# Patient Record
Sex: Female | Born: 1985 | Race: Black or African American | Hispanic: No | Marital: Single | Smoking: Never smoker
Health system: Southern US, Community
[De-identification: ages and names within clinical notes are randomized; demographics above are authoritative.]

---
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
## 2004-01-29 ENCOUNTER — Emergency Department (HOSPITAL_COMMUNITY): Admission: EM | Admit: 2004-01-29 | Discharge: 2004-01-29 | Payer: Self-pay

## 2004-01-31 ENCOUNTER — Other Ambulatory Visit: Admission: RE | Admit: 2004-01-31 | Discharge: 2004-01-31 | Payer: Self-pay | Admitting: Obstetrics and Gynecology

## 2004-05-09 ENCOUNTER — Emergency Department (HOSPITAL_COMMUNITY): Admission: EM | Admit: 2004-05-09 | Discharge: 2004-05-09 | Payer: Self-pay | Admitting: Emergency Medicine

## 2004-10-06 ENCOUNTER — Emergency Department (HOSPITAL_COMMUNITY): Admission: EM | Admit: 2004-10-06 | Discharge: 2004-10-07 | Payer: Self-pay | Admitting: Emergency Medicine

## 2005-03-05 ENCOUNTER — Other Ambulatory Visit: Admission: RE | Admit: 2005-03-05 | Discharge: 2005-03-05 | Payer: Self-pay | Admitting: Obstetrics and Gynecology

## 2005-04-15 ENCOUNTER — Emergency Department (HOSPITAL_COMMUNITY): Admission: EM | Admit: 2005-04-15 | Discharge: 2005-04-15 | Payer: Self-pay | Admitting: Emergency Medicine

## 2005-08-26 ENCOUNTER — Emergency Department (HOSPITAL_COMMUNITY): Admission: EM | Admit: 2005-08-26 | Discharge: 2005-08-26 | Payer: Self-pay | Admitting: Family Medicine

## 2005-09-27 ENCOUNTER — Inpatient Hospital Stay (HOSPITAL_COMMUNITY): Admission: AD | Admit: 2005-09-27 | Discharge: 2005-10-03 | Payer: Self-pay | Admitting: Obstetrics and Gynecology

## 2005-10-07 ENCOUNTER — Inpatient Hospital Stay (HOSPITAL_COMMUNITY): Admission: AD | Admit: 2005-10-07 | Discharge: 2005-10-07 | Payer: Self-pay | Admitting: Obstetrics and Gynecology

## 2005-10-16 ENCOUNTER — Ambulatory Visit: Admission: RE | Admit: 2005-10-16 | Discharge: 2005-10-16 | Payer: Self-pay | Admitting: Obstetrics and Gynecology

## 2006-04-16 ENCOUNTER — Emergency Department (HOSPITAL_COMMUNITY): Admission: EM | Admit: 2006-04-16 | Discharge: 2006-04-16 | Payer: Self-pay | Admitting: Family Medicine

## 2007-12-07 IMAGING — US US OB TRANSVAGINAL MODIFY
1 series · 13 of 28 positions shown · non-contrast
Comparison: none

CLINICAL DATA: Headache.  Question pregnancy induced hypertension, contractions.

[Series 1: us ob transvaginal modify · 0.29mm/px · 13 of 29 slices shown]
[im 2/29]
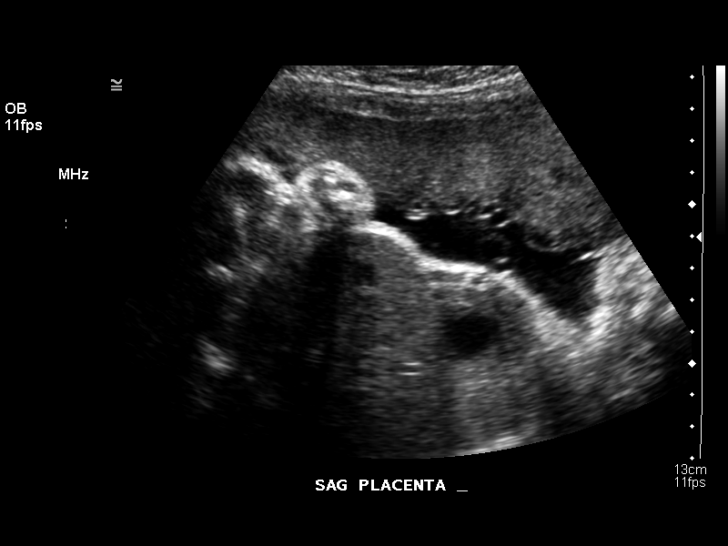
[im 4/29]
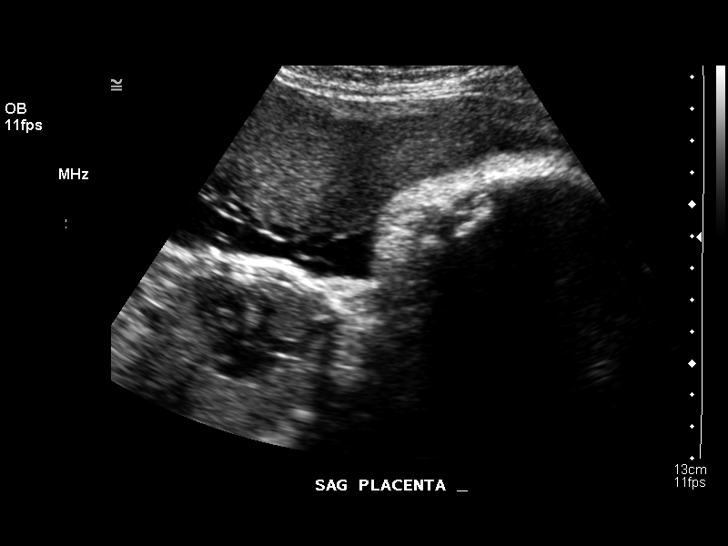
[im 6/29]
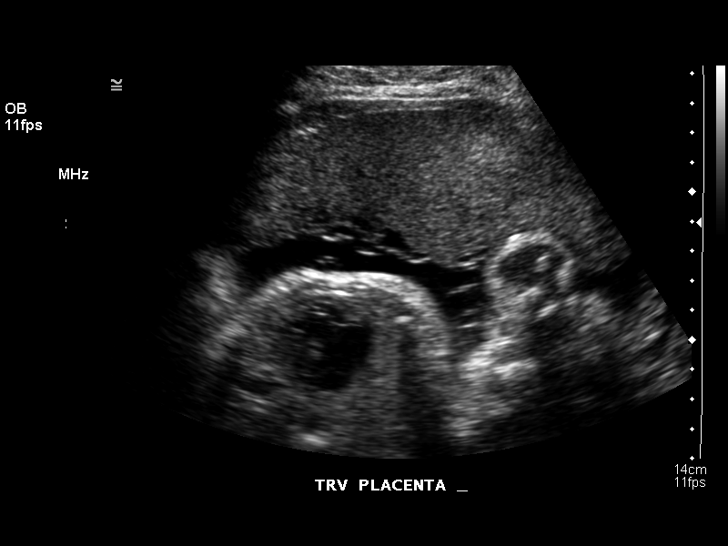
[im 8/29]
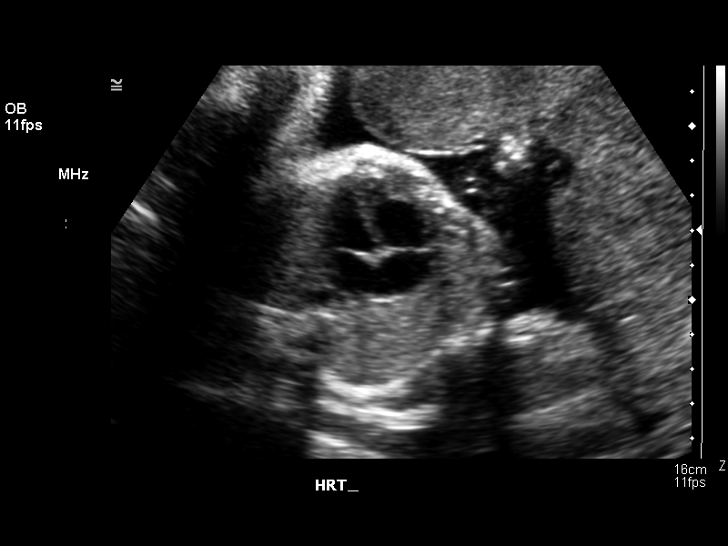
[im 10/29]
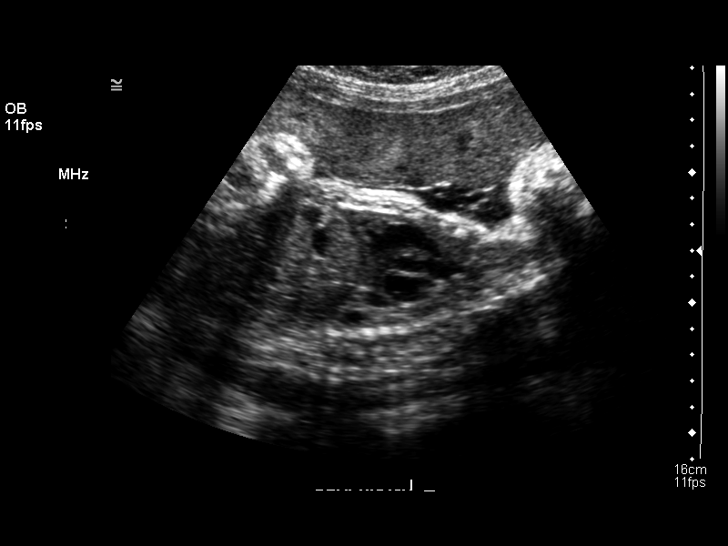
[im 12/29]
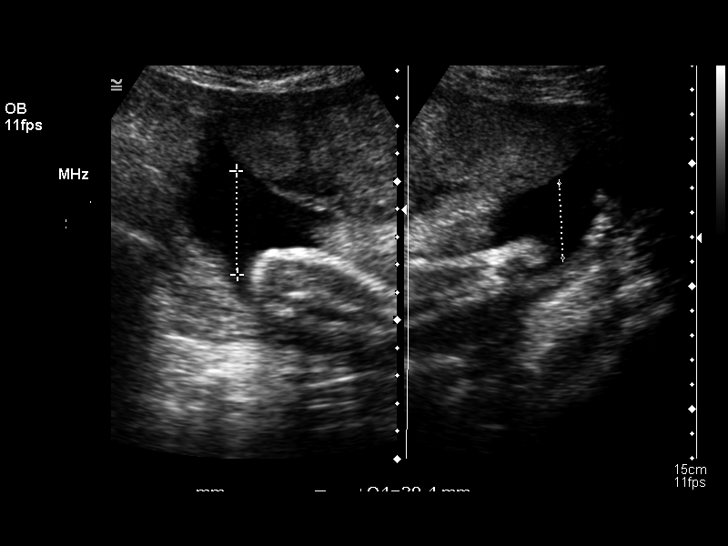
[im 15/29]
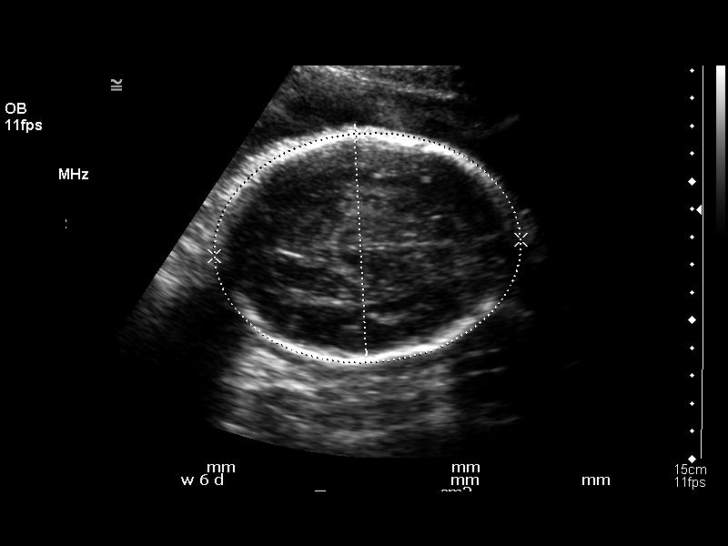
[im 17/29]
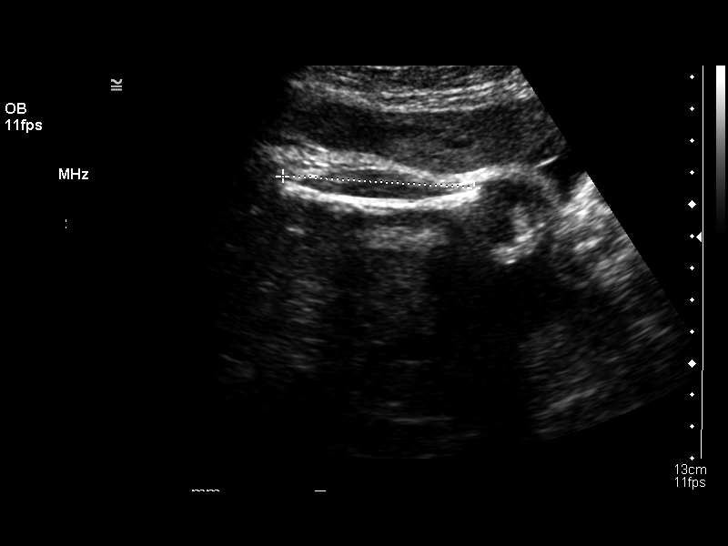
[im 19/29]
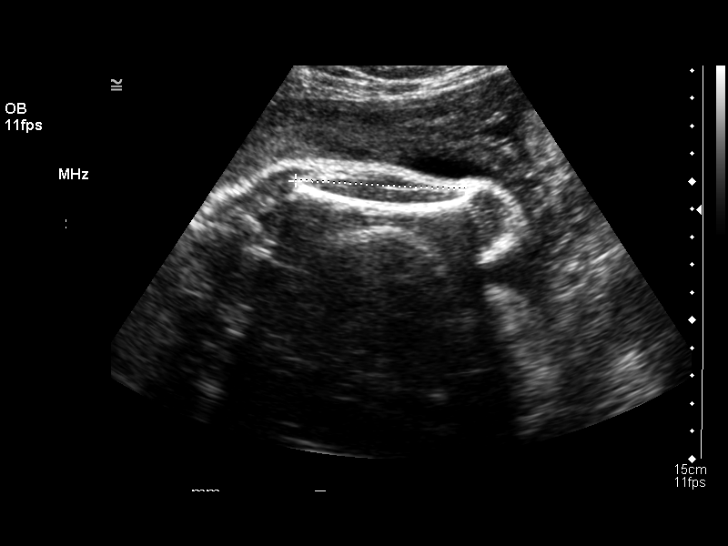
[im 21/29]
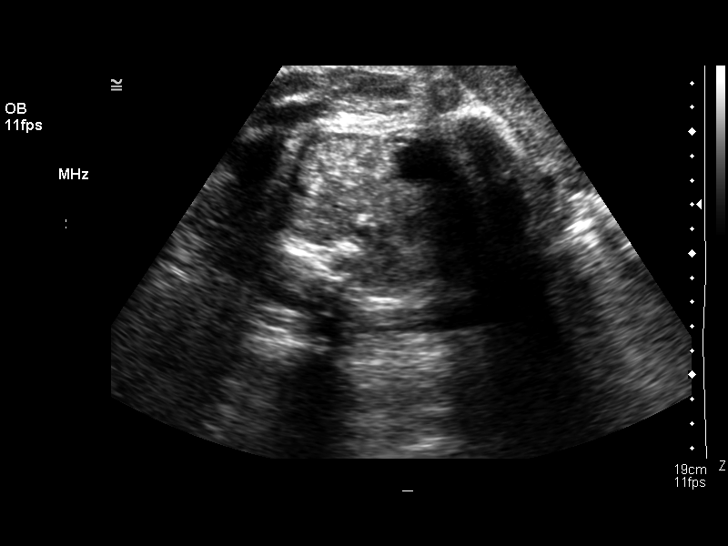
[im 23/29]
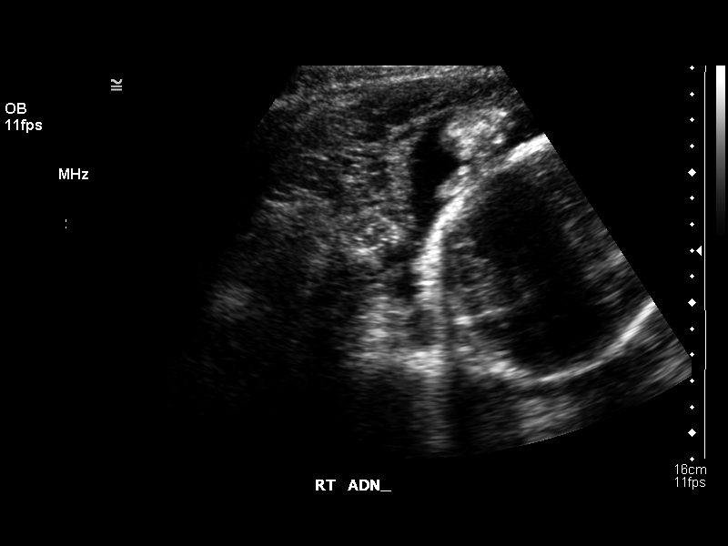
[im 25/29]
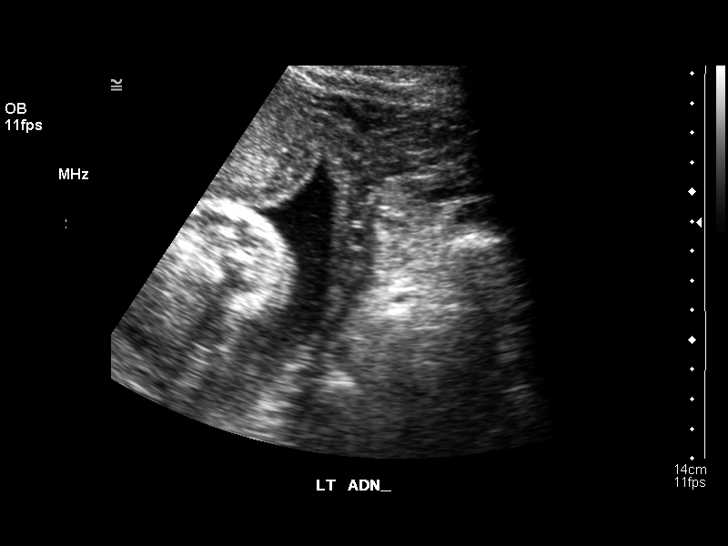
[im 27/29]
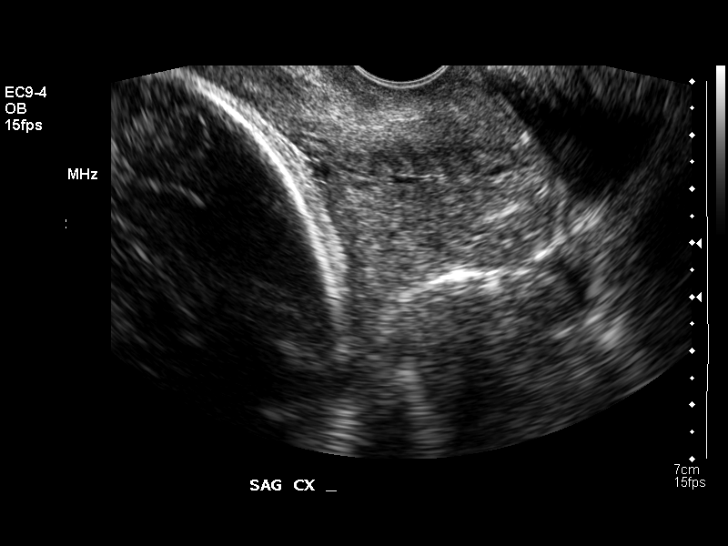

[13 of 28 positions shown; findings below may reference images not displayed]

OBSTETRICAL ULTRASOUND AND TRANSVAGINAL OB US:
 Transvaginal exam was performed to optimally assess cervical length.
 Number of Fetuses: 1
 Heart Rate:  147
 Movement:  Yes
 Breathing:  No  
 Presentation:  Transverse with head on the maternal left
 Placental Location:  Anterior
 Grade:  I
 Previa:  No
 Amniotic Fluid (Subjective):  Normal
 Amniotic Fluid (Objective):   12.5 cm AFI (5th ? 95th%ile = 8.3 ? 24.5 cm)

 FETAL BIOMETRY
 BPD:   8.4 cm   33 w 4 d
 HC:   30.4 cm   33 w 5 d
 AC:   28.8 cm  32 w 6 d
 FL:    6.2 cm   32 w 0 d

 MEAN GA:  33 w 0 d  US EDC:  11/15/05

 EFW: 2744 + / - 302 g (H) 50th ? 75th%ile (8089 ? 2811 g) For 33 wks

 FETAL ANATOMY
 Lateral Ventricles:    Not visualized
 Thalami/CSP:      Not visualized
 Posterior Fossa:  Not visualized
 Nuchal Region:    Not visualized
 Spine:      Not visualized
 4 Chamber Heart on Left:      Visualized
 Stomach on Left:      Visualized
 3 Vessel Cord:    Not visualized
 Cord Insertion site:    Not visualized
 Kidneys:  Visualized
 Bladder:  Visualized
 Extremities:      Not visualized

 ADDITIONAL ANATOMY VISUALIZED:  Diaphragm.

 MATERNAL UTERINE AND ADNEXAL FINDINGS
 Cervix:   3.4 cm transvaginally
IMPRESSION: 1.  Single live intrauterine gestation in transverse presentation, head maternal left, with average ultrasound 33 weeks 0 days (EDC 11/15/05), which is concordant with the gestational age by last menstrual period of 32 weeks 5 days (EDC by LMP 11/17/05).
 [DATE].  The cervix is long and closed.

## 2007-12-09 IMAGING — US US FETAL BPP W/O NONSTRESS
1 series · 14 of 28 positions shown · non-contrast
Comparison: 09/27/05.

CLINICAL DATA: 20-year-old with severe headache.  Pregnancy induced hypertension.  

 BIOPHYSICAL PROFILE

[Series 1: us fetal bpp w/o nonstress · 0.22mm/px · 14 of 28 slices shown]
[im 2/28]
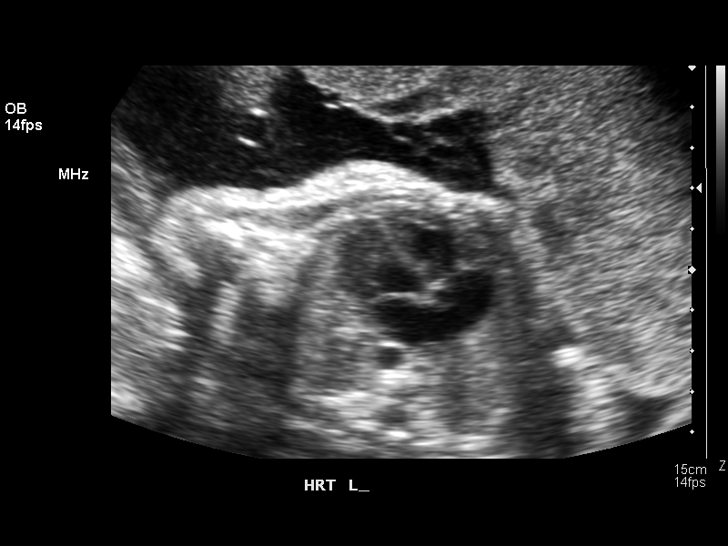
[im 4/28]
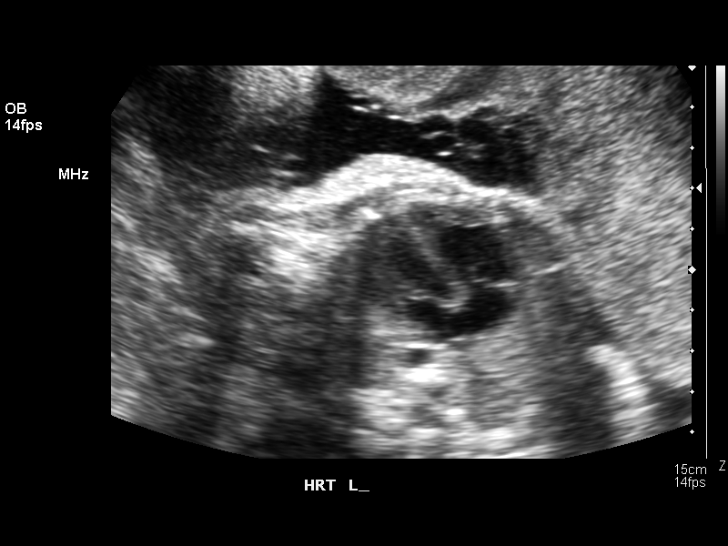
[im 6/28]
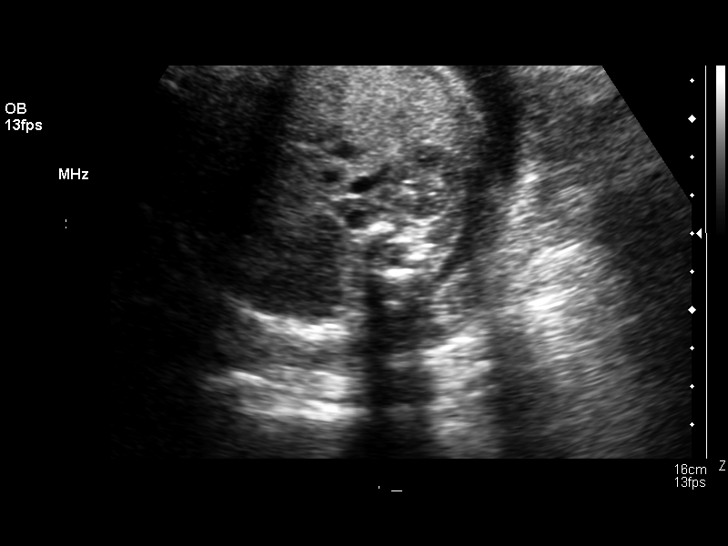
[im 8/28]
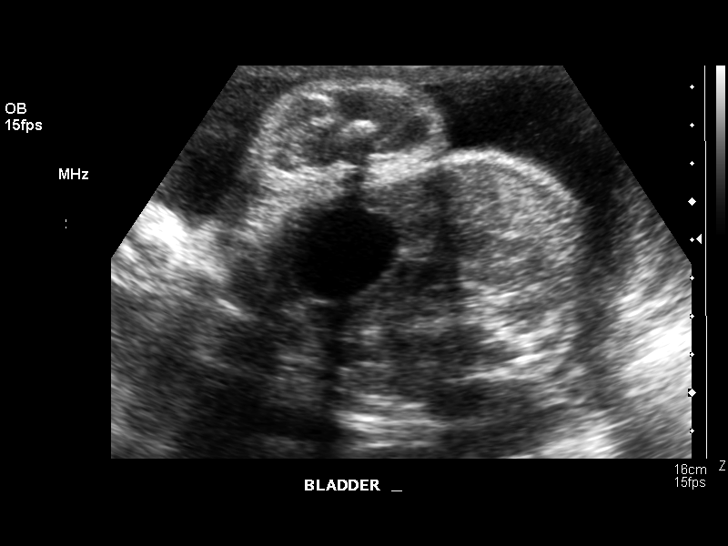
[im 10/28]
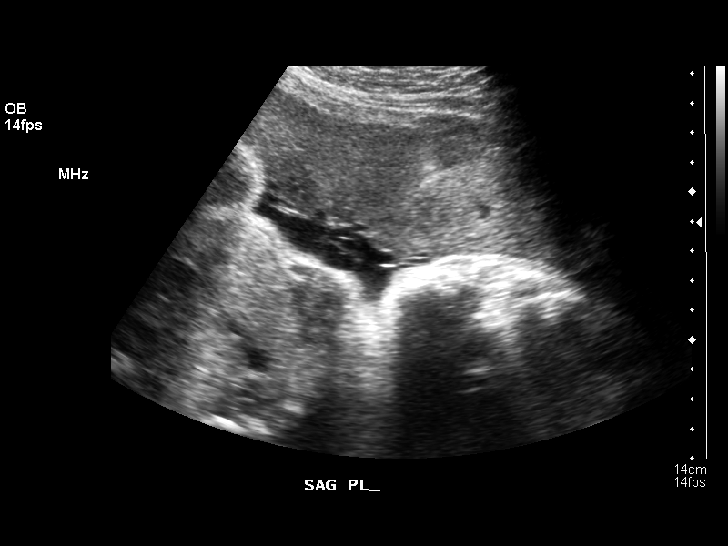
[im 12/28]
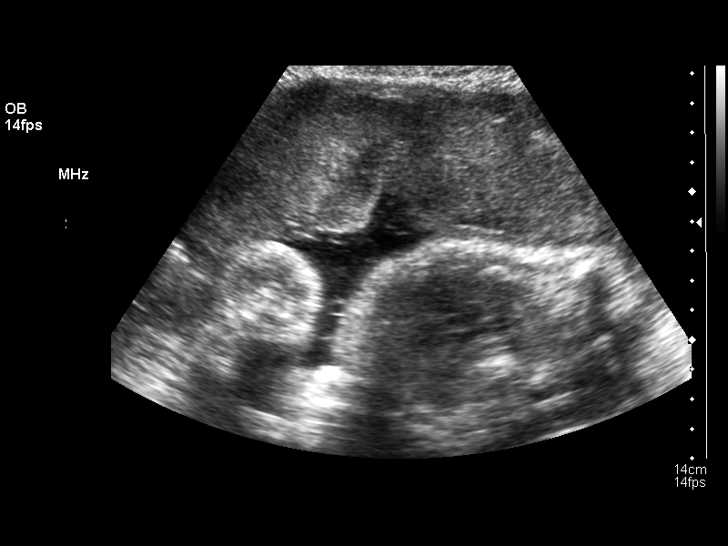
[im 14/28]
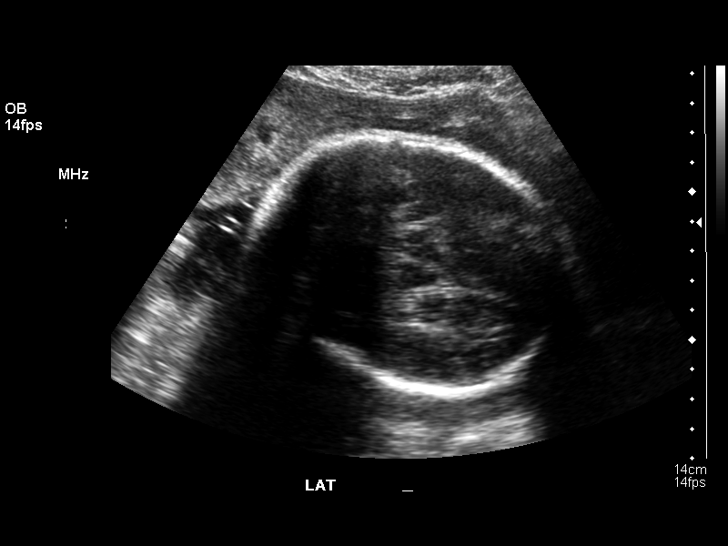
[im 16/28]
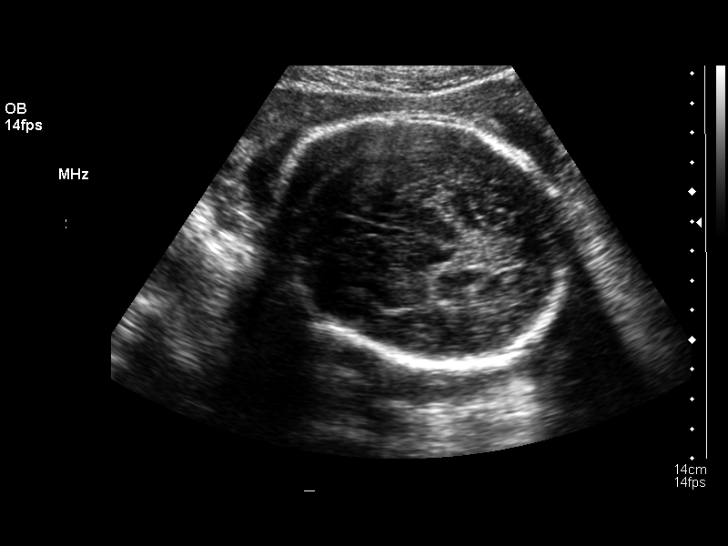
[im 18/28]
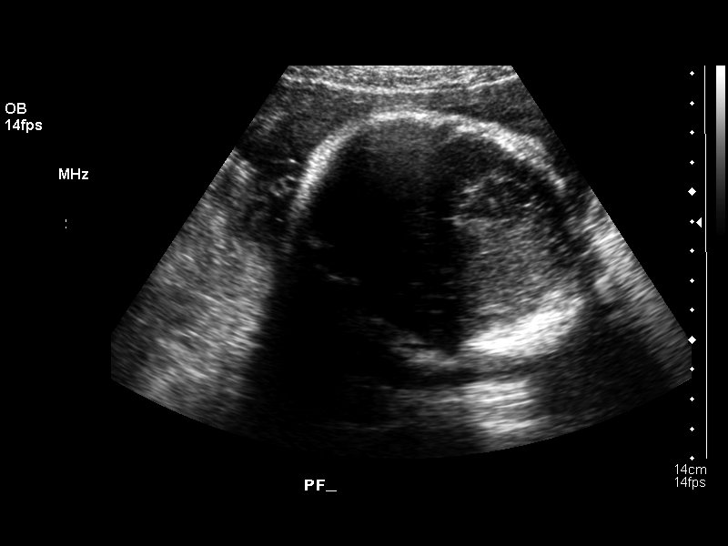
[im 20/28]
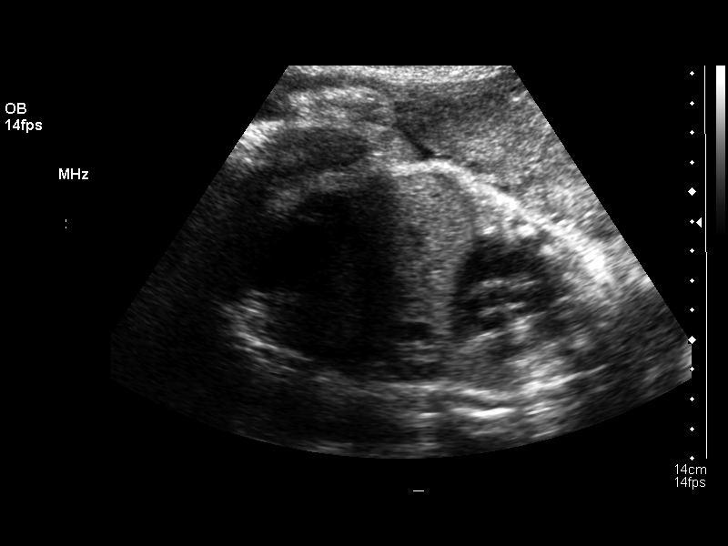
[im 22/28]
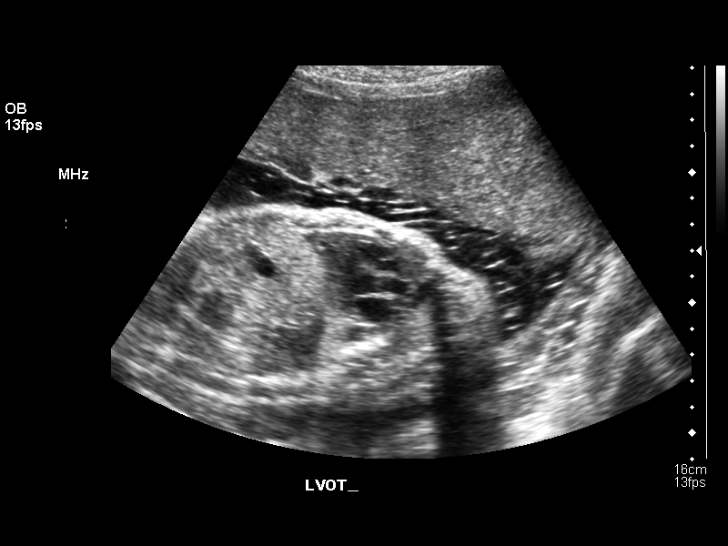
[im 24/28]
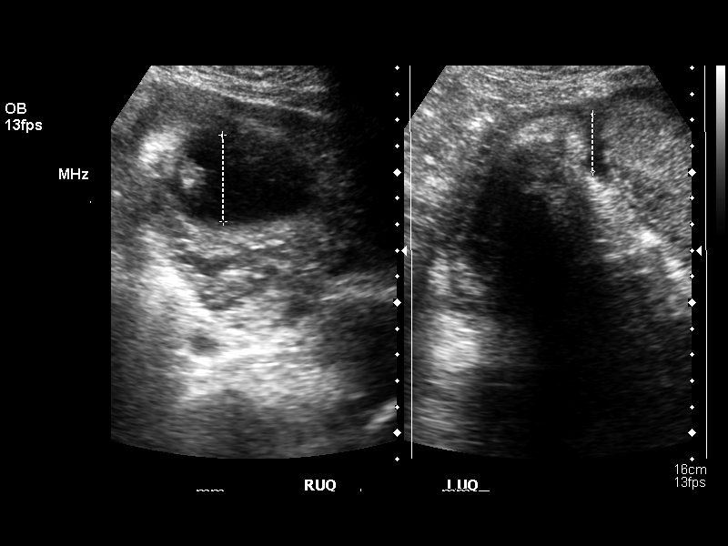
[im 26/28]
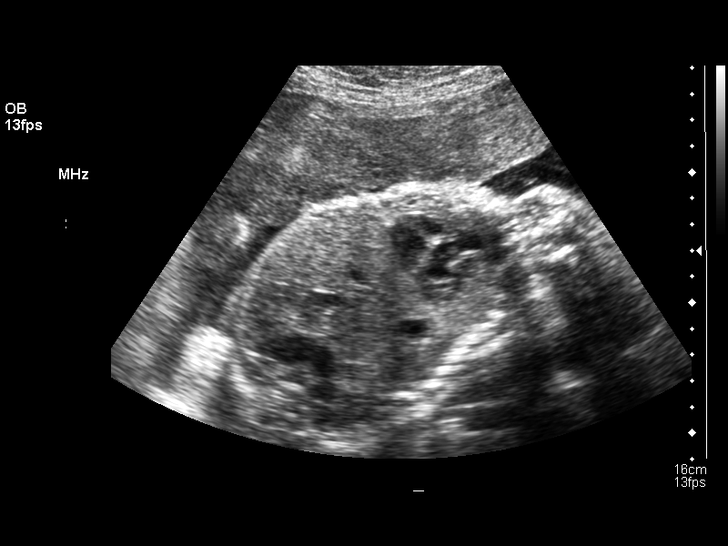
[im 28/28]
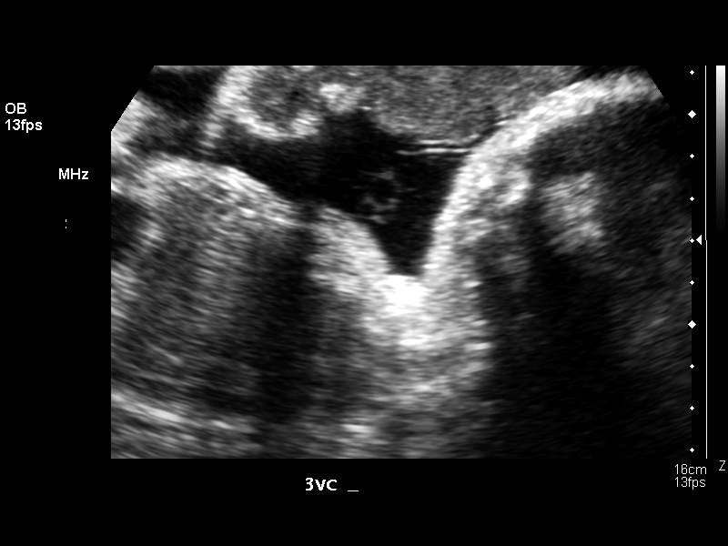

[14 of 28 positions shown; findings below may reference images not displayed]

Number of Fetuses: 1
 Heart rate:  150
 Movement:  Yes
 Breathing:  No
 Presentation:  Cephalic
 Placental Location:  Anterior
 Grade:  II
 Previa:  No
 Amniotic Fluid (Subjective): Normal
 Amniotic Fluid (Objective): 12.0 cm AFI (5th - 95th%ile = 8.3 ? 24.5 cm for 33 wks)

 Fetal measurements and complete anatomic evaluation were not requested.  The following fetal anatomy was visualized on this exam:  Lateral ventricles, thalami, posterior fossa, four chamber heart, stomach, 3-vessel cord, kidneys, bladder, LVOT, RVOT, orbits, and diaphragm.  

 BPP SCORING
 Movements:  2  Time:  30 minutes
 Breathing:  0
 Tone:  2
 Amniotic Fluid:  2
 Total Score:  6

 MATERNAL UTERINE AND ADNEXAL FINDINGS
 Cervix:   Not evaluated.
IMPRESSION: 1.  Single living intrauterine fetus in cephalic presentation with anterior, grade II placenta and normal amniotic fluid volume.  
 2.  Biophysical profile score [DATE] at 30 minutes.  Zero points for breathing.

## 2010-12-19 ENCOUNTER — Inpatient Hospital Stay (INDEPENDENT_AMBULATORY_CARE_PROVIDER_SITE_OTHER)
Admission: RE | Admit: 2010-12-19 | Discharge: 2010-12-19 | Disposition: A | Discharge status: Home / Self Care | Payer: Medicaid Other | Source: Ambulatory Visit | Attending: Family Medicine | Admitting: Family Medicine

## 2010-12-19 DIAGNOSIS — R51 Headache: Secondary | ICD-10-CM

## 2012-05-06 ENCOUNTER — Encounter (HOSPITAL_COMMUNITY): Payer: Self-pay | Admitting: *Deleted

## 2012-05-06 ENCOUNTER — Emergency Department (HOSPITAL_COMMUNITY)
Admission: EM | Admit: 2012-05-06 | Discharge: 2012-05-07 | Disposition: A | Discharge status: Home / Self Care | Payer: Medicaid Other | Attending: Emergency Medicine | Admitting: Emergency Medicine

## 2012-05-06 DIAGNOSIS — Z3202 Encounter for pregnancy test, result negative: Secondary | ICD-10-CM | POA: Insufficient documentation

## 2012-05-06 DIAGNOSIS — N390 Urinary tract infection, site not specified: Secondary | ICD-10-CM | POA: Insufficient documentation

## 2012-05-06 LAB — URINALYSIS, ROUTINE W REFLEX MICROSCOPIC
Nitrite: NEGATIVE
Specific Gravity, Urine: 1.031 — ABNORMAL HIGH (ref 1.005–1.030)
Urobilinogen, UA: 1 mg/dL (ref 0.0–1.0)

## 2012-05-06 LAB — POCT PREGNANCY, URINE: Preg Test, Ur: NEGATIVE

## 2012-05-06 LAB — URINE MICROSCOPIC-ADD ON

## 2012-05-06 NOTE — ED Notes (Signed)
Pt c/o severe burning; pain with urination; frequency x 1 wk; seen by pcp on Monday diagnosed with yeast infection and given diflucan; states at that time was tested for STDs and was neg; states also had neg urine at that time; c/o strong odor and cloudy urine

## 2012-05-07 MED ORDER — SULFAMETHOXAZOLE-TMP DS 800-160 MG PO TABS
1.0000 | ORAL_TABLET | Freq: Once | ORAL | Status: AC
  Administered 2012-05-07: 1 via ORAL
  Filled 2012-05-07: qty 1

## 2012-05-07 MED ORDER — PHENAZOPYRIDINE HCL 200 MG PO TABS
200.0000 mg | ORAL_TABLET | Freq: Three times a day (TID) | ORAL | Status: DC
  Filled 2012-05-07: qty 1

## 2012-05-07 MED ORDER — SULFAMETHOXAZOLE-TMP DS 800-160 MG PO TABS: 1.0000 | ORAL_TABLET | Freq: Two times a day (BID) | ORAL | Status: DC

## 2012-05-07 MED ORDER — PHENAZOPYRIDINE HCL 200 MG PO TABS
200.0000 mg | ORAL_TABLET | Freq: Three times a day (TID) | ORAL | Status: DC
  Administered 2012-05-07: 200 mg via ORAL

## 2012-05-07 MED ORDER — PHENAZOPYRIDINE HCL 200 MG PO TABS: 200.0000 mg | ORAL_TABLET | Freq: Three times a day (TID) | ORAL | Status: DC

## 2012-05-07 NOTE — ED Provider Notes (Signed)
History     CSN: [id number]  Arrival date & time 05/06/12  1955   First MD Initiated Contact with Patient 05/06/12 2317      Chief Complaint  Patient presents with  . Dysuria    (Consider location/radiation/quality/duration/timing/severity/associated sxs/prior treatment) HPI 27 yo female presents to the ER with complaint of dysuria.  She was seen at the health dept earlier in the week, had reported negative urine, neg STD check, and thought to have yeast infection.  Pt has taken medications, but symptoms have worsened.  No fever, no n/v,no back pain.  History reviewed. No pertinent past medical history.  History reviewed. No pertinent past surgical history.  No family history on file.  History  Substance Use Topics  . Smoking status: Never Smoker   . Smokeless tobacco: Not on file  . Alcohol Use: No    OB History   Grav Para Term Preterm Abortions TAB SAB Ect Mult Living                  Review of Systems  All other systems reviewed and are negative.    Allergies  Review of patient's allergies indicates no known allergies.  Home Medications   Current Outpatient Rx  Name  Route  Sig  Dispense  Refill  . fluconazole (DIFLUCAN) 150 MG tablet   Oral   Take 150 mg by mouth once.         . miconazole (MICOTIN) 2 % cream   Topical   Apply 1 application topically 2 (two) times daily.         . phenazopyridine (PYRIDIUM) 200 MG tablet   Oral   Take 1 tablet (200 mg total) by mouth 3 (three) times daily.   6 tablet   0   . sulfamethoxazole-trimethoprim (BACTRIM DS) 800-160 MG per tablet   Oral   Take 1 tablet by mouth 2 (two) times daily.   10 tablet   0     BP 114/74  Pulse 64  Temp(Src) 99 F (37.2 C) (Oral)  Resp 18  SpO2 100%  LMP 03/27/2012  Physical Exam  Nursing note and vitals reviewed. Constitutional: She is oriented to person, place, and time. She appears well-developed and well-nourished.  HENT:  Head: Normocephalic and  atraumatic.  Nose: Nose normal.  Mouth/Throat: Oropharynx is clear and moist.  Eyes: Conjunctivae and EOM are normal. Pupils are equal, round, and reactive to light.  Neck: Normal range of motion. Neck supple. No JVD present. No tracheal deviation present. No thyromegaly present.  Cardiovascular: Normal rate, regular rhythm, normal heart sounds and intact distal pulses.  Exam reveals no gallop and no friction rub.   No murmur heard. Pulmonary/Chest: Effort normal and breath sounds normal. No stridor. No respiratory distress. She has no wheezes. She has no rales. She exhibits no tenderness.  Abdominal: Soft. Bowel sounds are normal. She exhibits no distension and no mass. There is no tenderness. There is no rebound and no guarding.  Musculoskeletal: Normal range of motion. She exhibits no edema and no tenderness.  Lymphadenopathy:    She has no cervical adenopathy.  Neurological: She is alert and oriented to person, place, and time. She exhibits normal muscle tone. Coordination normal.  Skin: Skin is warm and dry. No rash noted. No erythema. No pallor.  Psychiatric: She has a normal mood and affect. Her behavior is normal. Judgment and thought content normal.    ED Course  Procedures (including critical care time)  Labs Reviewed  URINALYSIS, ROUTINE W REFLEX MICROSCOPIC - Abnormal; Notable for the following:    APPearance CLOUDY (*)    Specific Gravity, Urine 1.031 (*)    Hgb urine dipstick TRACE (*)    Protein, ur 30 (*)    Leukocytes, UA MODERATE (*)    All other components within normal limits  URINE CULTURE  URINE MICROSCOPIC-ADD ON  POCT PREGNANCY, URINE   No results found.   1. Urinary tract infection       MDM  Patient seen by health department earlier in the week, had STD screening, reports she had a normal urine, and was told she had yeast infection.  Patient has signs of UTI, here.  Will treat with Pyridium and Bactrim.        Olivia Mackie, MD 05/07/12  [phone]

## 2012-05-09 LAB — URINE CULTURE

## 2012-05-10 ENCOUNTER — Telehealth (HOSPITAL_COMMUNITY): Payer: Self-pay | Admitting: Emergency Medicine

## 2012-05-10 NOTE — ED Notes (Signed)
Patient has +Urine culture. Checking to see if treated appropriately. °

## 2012-05-10 NOTE — ED Notes (Signed)
+  Urine. Patient treated with Bactrim. Sensitive to same. Per protocol MD.

## 2012-11-16 ENCOUNTER — Encounter (HOSPITAL_COMMUNITY): Payer: Self-pay | Admitting: Emergency Medicine

## 2012-11-16 ENCOUNTER — Emergency Department (HOSPITAL_COMMUNITY)
Admission: EM | Admit: 2012-11-16 | Discharge: 2012-11-16 | Disposition: A | Discharge status: Home / Self Care | Payer: Medicaid Other | Attending: Emergency Medicine | Admitting: Emergency Medicine

## 2012-11-16 DIAGNOSIS — R110 Nausea: Secondary | ICD-10-CM | POA: Insufficient documentation

## 2012-11-16 DIAGNOSIS — H53149 Visual discomfort, unspecified: Secondary | ICD-10-CM | POA: Insufficient documentation

## 2012-11-16 DIAGNOSIS — R51 Headache: Secondary | ICD-10-CM | POA: Insufficient documentation

## 2012-11-16 MED ORDER — METOCLOPRAMIDE HCL 5 MG/ML IJ SOLN
10.0000 mg | Freq: Once | INTRAMUSCULAR | Status: AC
  Administered 2012-11-16: 10 mg via INTRAVENOUS
  Filled 2012-11-16: qty 2

## 2012-11-16 MED ORDER — DIPHENHYDRAMINE HCL 50 MG/ML IJ SOLN
25.0000 mg | Freq: Once | INTRAMUSCULAR | Status: AC
  Administered 2012-11-16: 25 mg via INTRAVENOUS
  Filled 2012-11-16: qty 1

## 2012-11-16 MED ORDER — SODIUM CHLORIDE 0.9 % IV BOLUS (SEPSIS)
1000.0000 mL | Freq: Once | INTRAVENOUS | Status: AC
  Administered 2012-11-16: 1000 mL via INTRAVENOUS

## 2012-11-16 MED ORDER — KETOROLAC TROMETHAMINE 30 MG/ML IJ SOLN
30.0000 mg | Freq: Once | INTRAMUSCULAR | Status: AC
  Administered 2012-11-16: 30 mg via INTRAVENOUS
  Filled 2012-11-16: qty 1

## 2012-11-16 NOTE — ED Notes (Signed)
Pt presents to the ED with a complaint of a headache that is accompinied  by generalized body aches.   Pt states she feels that her head is going to bust.  Pt has been experiencing symptoms since Friday

## 2012-11-16 NOTE — ED Provider Notes (Signed)
CSN: [id number]     Arrival date & time 11/16/12  0353 History   First MD Initiated Contact with Patient 11/16/12 0434     Chief Complaint  Patient presents with  . Headache   (Consider location/radiation/quality/duration/timing/severity/associated sxs/prior Treatment) HPI Comments: Patient is a 27 year old female who presents with a headache for 3 days. Patient reports a gradual onset and progressive worsening of the headache. The pain is sharp, constant and is located in generalized head without radiation. Patient has tried nothing for symptoms without relief. No alleviating/aggravating factors. Patient reports associated nausea and photophobia. Patient denies fever, vomiting, diarrhea, numbness/tingling, weakness, visual changes, congestion, chest pain, SOB, abdominal pain.      History reviewed. No pertinent past medical history. History reviewed. No pertinent past surgical history. History reviewed. No pertinent family history. History  Substance Use Topics  . Smoking status: Never Smoker   . Smokeless tobacco: Not on file  . Alcohol Use: No   OB History   Grav Para Term Preterm Abortions TAB SAB Ect Mult Living                 Review of Systems  Eyes: Positive for photophobia.  Gastrointestinal: Positive for nausea.  Neurological: Positive for headaches.  All other systems reviewed and are negative.    Allergies  Review of patient's allergies indicates no known allergies.  Home Medications   Current Outpatient Rx  Name  Route  Sig  Dispense  Refill  . pseudoephedrine-acetaminophen (TYLENOL SINUS) 30-500 MG TABS   Oral   Take 1 tablet by mouth every 4 (four) hours as needed (cold symptoms).          BP 125/82  Pulse 93  Temp(Src) 98.8 F (37.1 C) (Oral)  Resp 19  Ht 5\' 4"  (1.626 m)  Wt 135 lb (61.236 kg)  BMI 23.16 kg/m2  SpO2 98%  LMP 11/13/2012 Physical Exam  Nursing note and vitals reviewed. Constitutional: She is oriented to person, place, and  time. She appears well-developed and well-nourished. No distress.  HENT:  Head: Normocephalic and atraumatic.  Eyes: Conjunctivae and EOM are normal. Pupils are equal, round, and reactive to light.  Neck: Normal range of motion.  Cardiovascular: Normal rate and regular rhythm.  Exam reveals no gallop and no friction rub.   No murmur heard. Pulmonary/Chest: Effort normal and breath sounds normal. She has no wheezes. She has no rales. She exhibits no tenderness.  Abdominal: Soft. She exhibits no distension. There is no tenderness. There is no rebound.  Musculoskeletal: Normal range of motion.  Neurological: She is alert and oriented to person, place, and time. Coordination normal.  No meningeal signs. Speech is goal-oriented. Moves limbs without ataxia.   Skin: Skin is warm and dry.  Psychiatric: She has a normal mood and affect. Her behavior is normal.    ED Course  Procedures (including critical care time) Labs Review Labs Reviewed - No data to display Imaging Review No results found.  MDM   1. Headache     4:35 AM Patient will have fluids, toradol, reglan, and benadryl for headache. Vitals stable and patient afebrile.   5:33 AM Patient reports her headache is gone. Patient will be discharged without further evaluation. Vitals stable and patient afebrile.    Emilia Beck, [place] 11/16/12 [phone]

## 2012-11-16 NOTE — ED Provider Notes (Signed)
Medical screening examination/treatment/procedure(s) were performed by non-physician practitioner and as supervising physician I was immediately available for consultation/collaboration.   Kelwin Gibler, MD 11/16/12 0707 

## 2012-12-07 ENCOUNTER — Encounter (HOSPITAL_COMMUNITY): Payer: Self-pay | Admitting: Emergency Medicine

## 2012-12-07 ENCOUNTER — Emergency Department (HOSPITAL_COMMUNITY)
Admission: EM | Admit: 2012-12-07 | Discharge: 2012-12-07 | Disposition: A | Discharge status: Home / Self Care | Payer: Medicaid Other | Attending: Emergency Medicine | Admitting: Emergency Medicine

## 2012-12-07 DIAGNOSIS — H9209 Otalgia, unspecified ear: Secondary | ICD-10-CM | POA: Insufficient documentation

## 2012-12-07 DIAGNOSIS — J029 Acute pharyngitis, unspecified: Secondary | ICD-10-CM | POA: Insufficient documentation

## 2012-12-07 MED ORDER — IBUPROFEN 600 MG PO TABS: 600.0000 mg | ORAL_TABLET | Freq: Four times a day (QID) | ORAL | Status: DC | PRN

## 2012-12-07 MED ORDER — IBUPROFEN 200 MG PO TABS
600.0000 mg | ORAL_TABLET | Freq: Once | ORAL | Status: AC
  Administered 2012-12-07: 600 mg via ORAL
  Filled 2012-12-07: qty 3

## 2012-12-07 NOTE — ED Notes (Signed)
Patient states that she has had a sore throat and ear pain since Friday. States she lost her voice last night.

## 2012-12-07 NOTE — ED Provider Notes (Signed)
CSN: [id number]     Arrival date & time 12/07/12  1932 History   First MD Initiated Contact with Patient 12/07/12 2003     This chart was scribed for Grace Reed by Ladona Ridgel Day, ED scribe. This patient was seen in room WTR8/WTR8 and the patient's care was started at 1932.  Chief Complaint  Patient presents with  . Sore Throat  . Otalgia   The history is provided by the patient. No language interpreter was used.   HPI Comments: Grace Reed is a 27 y.o. female who presents to the Emergency Department complaining of constant, gradually worsened sore throat, hoarse voice, onset 3 days ago. She states a hoarse cough but is unable to cough anything up. She denies fever, rhinorrhea. She states associated bilateral ear ache. She states her sore throat began first before her ear ache. She has not taken any medicines for this problem.   History reviewed. No pertinent past medical history. No past surgical history on file. No family history on file. History  Substance Use Topics  . Smoking status: Never Smoker   . Smokeless tobacco: Not on file  . Alcohol Use: No   OB History   Grav Para Term Preterm Abortions TAB SAB Ect Mult Living                 Review of Systems  Constitutional: Negative for fever.  HENT: Positive for ear pain (bilateral) and sore throat. Negative for ear discharge, postnasal drip and rhinorrhea.   Respiratory: Positive for cough. Negative for shortness of breath and wheezing.   Gastrointestinal: Negative for abdominal pain.  Musculoskeletal: Negative for neck pain.  Skin: Negative for rash.  Neurological: Negative for headaches.  All other systems reviewed and are negative.   A complete 10 system review of systems was obtained and all systems are negative except as noted in the HPI and PMH.   Allergies  Review of patient's allergies indicates no known allergies.  Home Medications  No current outpatient prescriptions on file. LMP 11/13/2012 Physical  Exam  Nursing note and vitals reviewed. Constitutional: She is oriented to person, place, and time. She appears well-developed and well-nourished. No distress.  HENT:  Head: Normocephalic and atraumatic.  Right Ear: External ear normal.  Left Ear: External ear normal.  Mouth/Throat: Uvula is midline. Posterior oropharyngeal erythema present.  Throat irritated but no exudate, uvula midline, no swollen. Voice is hoarse.   Eyes: Pupils are equal, round, and reactive to light.  Neck: Normal range of motion. Neck supple. No tracheal deviation present.  Cardiovascular: Normal rate.   Pulmonary/Chest: Effort normal. No respiratory distress.  Musculoskeletal: Normal range of motion.  Neurological: She is alert and oriented to person, place, and time.  Skin: Skin is warm and dry.  Psychiatric: She has a normal mood and affect. Her behavior is normal.    ED Course  Procedures (including critical care time) DIAGNOSTIC STUDIES: Oxygen Saturation is 99% on room air, normal by my interpretation.    COORDINATION OF CARE: At 840 PM Discussed treatment plan with patient which includes treatment.  Plan reassured patient that this is a viral process Patient agrees.   Labs Review Labs Reviewed - No data to display Imaging Review No results found.  EKG Interpretation   None       MDM  No diagnosis found. I personally performed the services described in this documentation, which was scribed in my presence. The recorded information has been reviewed and is accurate.  Arman Filter, NP 12/07/12 2104

## 2012-12-10 NOTE — ED Provider Notes (Signed)
Medical screening examination/treatment/procedure(s) were performed by non-physician practitioner and as supervising physician I was immediately available for consultation/collaboration.   Malesha Suliman J Carime Dinkel, MD 12/10/12 1401 

## 2013-04-15 ENCOUNTER — Emergency Department (HOSPITAL_COMMUNITY)
Admission: EM | Admit: 2013-04-15 | Discharge: 2013-04-15 | Disposition: A | Discharge status: Home / Self Care | Payer: Self-pay | Attending: Emergency Medicine | Admitting: Emergency Medicine

## 2013-04-15 ENCOUNTER — Encounter (HOSPITAL_COMMUNITY): Payer: Self-pay | Admitting: Emergency Medicine

## 2013-04-15 ENCOUNTER — Emergency Department (HOSPITAL_COMMUNITY): Payer: Medicaid Other

## 2013-04-15 DIAGNOSIS — Z79899 Other long term (current) drug therapy: Secondary | ICD-10-CM | POA: Insufficient documentation

## 2013-04-15 DIAGNOSIS — Z3202 Encounter for pregnancy test, result negative: Secondary | ICD-10-CM | POA: Insufficient documentation

## 2013-04-15 DIAGNOSIS — R1084 Generalized abdominal pain: Secondary | ICD-10-CM | POA: Insufficient documentation

## 2013-04-15 DIAGNOSIS — R109 Unspecified abdominal pain: Secondary | ICD-10-CM

## 2013-04-15 LAB — CBC WITH DIFFERENTIAL/PLATELET
Basophils Absolute: 0 10*3/uL (ref 0.0–0.1)
Basophils Relative: 1 % (ref 0–1)
EOS PCT: 2 % (ref 0–5)
Eosinophils Absolute: 0.1 10*3/uL (ref 0.0–0.7)
HEMATOCRIT: 37.3 % (ref 36.0–46.0)
HEMOGLOBIN: 12.9 g/dL (ref 12.0–15.0)
LYMPHS ABS: 2.4 10*3/uL (ref 0.7–4.0)
LYMPHS PCT: 50 % — AB (ref 12–46)
MCH: 29.9 pg (ref 26.0–34.0)
MCHC: 34.6 g/dL (ref 30.0–36.0)
MCV: 86.3 fL (ref 78.0–100.0)
MONOS PCT: 11 % (ref 3–12)
Monocytes Absolute: 0.5 10*3/uL (ref 0.1–1.0)
Neutro Abs: 1.7 10*3/uL (ref 1.7–7.7)
Neutrophils Relative %: 36 % — ABNORMAL LOW (ref 43–77)
PLATELETS: 213 10*3/uL (ref 150–400)
RBC: 4.32 MIL/uL (ref 3.87–5.11)
RDW: 12.3 % (ref 11.5–15.5)
WBC: 4.7 10*3/uL (ref 4.0–10.5)

## 2013-04-15 LAB — URINALYSIS, ROUTINE W REFLEX MICROSCOPIC
BILIRUBIN URINE: NEGATIVE
GLUCOSE, UA: NEGATIVE mg/dL
HGB URINE DIPSTICK: NEGATIVE
Ketones, ur: NEGATIVE mg/dL
Leukocytes, UA: NEGATIVE
NITRITE: NEGATIVE
PH: 7.5 (ref 5.0–8.0)
Protein, ur: NEGATIVE mg/dL
SPECIFIC GRAVITY, URINE: 1.022 (ref 1.005–1.030)
Urobilinogen, UA: 1 mg/dL (ref 0.0–1.0)

## 2013-04-15 LAB — LIPASE, BLOOD: Lipase: 50 U/L (ref 11–59)

## 2013-04-15 LAB — COMPREHENSIVE METABOLIC PANEL
ALK PHOS: 47 U/L (ref 39–117)
ALT: 6 U/L (ref 0–35)
AST: 16 U/L (ref 0–37)
Albumin: 3.6 g/dL (ref 3.5–5.2)
BUN: 12 mg/dL (ref 6–23)
CALCIUM: 9.2 mg/dL (ref 8.4–10.5)
CO2: 24 meq/L (ref 19–32)
Chloride: 102 mEq/L (ref 96–112)
Creatinine, Ser: 0.71 mg/dL (ref 0.50–1.10)
GLUCOSE: 97 mg/dL (ref 70–99)
Potassium: 4.2 mEq/L (ref 3.7–5.3)
SODIUM: 137 meq/L (ref 137–147)
Total Bilirubin: 0.2 mg/dL — ABNORMAL LOW (ref 0.3–1.2)
Total Protein: 7.5 g/dL (ref 6.0–8.3)

## 2013-04-15 LAB — PREGNANCY, URINE: Preg Test, Ur: NEGATIVE

## 2013-04-15 MED ORDER — IOHEXOL 300 MG/ML  SOLN
80.0000 mL | Freq: Once | INTRAMUSCULAR | Status: AC | PRN
  Administered 2013-04-15: 80 mL via INTRAVENOUS

## 2013-04-15 MED ORDER — MORPHINE SULFATE 4 MG/ML IJ SOLN
4.0000 mg | Freq: Once | INTRAMUSCULAR | Status: AC
  Administered 2013-04-15: 4 mg via INTRAVENOUS
  Filled 2013-04-15: qty 1

## 2013-04-15 MED ORDER — SODIUM CHLORIDE 0.9 % IV BOLUS (SEPSIS)
1000.0000 mL | Freq: Once | INTRAVENOUS | Status: AC
  Administered 2013-04-15: 1000 mL via INTRAVENOUS

## 2013-04-15 MED ORDER — IOHEXOL 300 MG/ML  SOLN: 25.0000 mL | INTRAMUSCULAR | Status: AC

## 2013-04-15 MED ORDER — ONDANSETRON HCL 4 MG/2ML IJ SOLN
4.0000 mg | Freq: Once | INTRAMUSCULAR | Status: AC
  Administered 2013-04-15: 4 mg via INTRAVENOUS
  Filled 2013-04-15: qty 2

## 2013-04-15 NOTE — Discharge Instructions (Signed)
Your lab testing and CAT scan of your abdomen and pelvis have not shown any signs for a concerning cause of your abdominal pain. At this time your providers feel that you may followup with a primary care provider for continued evaluation and treatment. Return anytime for changing or worsening symptoms.    Abdominal Pain, Adult Many things can cause abdominal pain. Usually, abdominal pain is not caused by a disease and will improve without treatment. It can often be observed and treated at home. Your health care provider will do a physical exam and possibly order blood tests and X-rays to help determine the seriousness of your pain. However, in many cases, more time must pass before a clear cause of the pain can be found. Before that point, your health care provider may not know if you need more testing or further treatment. HOME CARE INSTRUCTIONS  Monitor your abdominal pain for any changes. The following actions may help to alleviate any discomfort you are experiencing:  Only take over-the-counter or prescription medicines as directed by your health care provider.  Do not take laxatives unless directed to do so by your health care provider.  Try a clear liquid diet (broth, tea, or water) as directed by your health care provider. Slowly move to a bland diet as tolerated. SEEK MEDICAL CARE IF:  You have unexplained abdominal pain.  You have abdominal pain associated with nausea or diarrhea.  You have pain when you urinate or have a bowel movement.  You experience abdominal pain that wakes you in the night.  You have abdominal pain that is worsened or improved by eating food.  You have abdominal pain that is worsened with eating fatty foods. SEEK IMMEDIATE MEDICAL CARE IF:   Your pain does not go away within 2 hours.  You have a fever.  You keep throwing up (vomiting).  Your pain is felt only in portions of the abdomen, such as the right side or the left lower portion of the  abdomen.  You pass bloody or black tarry stools. MAKE SURE YOU:  Understand these instructions.   Will watch your condition.   Will get help right away if you are not doing well or get worse.  Document Released: 11/21/2004 Document Revised: 12/02/2012 Document Reviewed: 10/21/2012 [facility] LLCExit[facility] Patient Information 2014 [employer], [place].

## 2013-04-15 NOTE — ED Provider Notes (Signed)
Grace Reed 8:00PM Pt discussed in sign out.  Pt here with brief atypical abdominal pains.  Exam and labs unremarkable.  Ct scan pending.  Low clinical suspicion for acute abd process.  CT scan unremarkable. Discussed findings with patient she is continued to be pain-free and is ready to return home.  Angus Sellereter Reed Mylisa Brunson, PA-C 04/15/13 2314

## 2013-04-15 NOTE — ED Provider Notes (Signed)
CSN: [id number]     Arrival date & time 04/15/13  1513 History   First MD Initiated Contact with Patient 04/15/13 1706     Chief Complaint  Patient presents with  . Abdominal Pain     (Consider location/radiation/quality/duration/timing/severity/associated sxs/prior Treatment) Patient is a 28 y.o. female presenting with abdominal pain. The history is provided by the patient and medical records. No language interpreter was used.  Abdominal Pain Associated symptoms: no chest pain, no constipation, no cough, no diarrhea, no dysuria, no fatigue, no fever, no hematuria, no nausea, no shortness of breath and no vomiting    Grace Reed is a 28 y.o. female  with no known medical history presents to the Emergency Department complaining of gradual, persistent, progressively worsening sharp, stabbing periumbilical abdominal pain onset 3 minutes prior to arrival.  Patient reports she was at work when the pain began. She attempted to have a bowel movement. Able. When the pain did not subside after 30 minutes she came to the emergency department.  He has no other associated symptoms and denies nausea, vomiting or diarrhea. She has no known sick contacts. Patient also denies fever, chills, headache, neck pain, chest pain, shortness of breath, weakness, dizziness, syncope, dysuria.  Patient reports that nothing makes the pain better and attempting to walk, change positions or stretch out makes it significantly worse.  History reviewed. No pertinent past medical history. History reviewed. No pertinent past surgical history. No family history on file. History  Substance Use Topics  . Smoking status: Never Smoker   . Smokeless tobacco: Not on file  . Alcohol Use: No   OB History   Grav Para Term Preterm Abortions TAB SAB Ect Mult Living                 Review of Systems  Constitutional: Negative for fever, diaphoresis, appetite change, fatigue and unexpected weight change.  HENT: Negative for mouth  sores and trouble swallowing.   Respiratory: Negative for cough, chest tightness, shortness of breath, wheezing and stridor.   Cardiovascular: Negative for chest pain and palpitations.  Gastrointestinal: Positive for abdominal pain. Negative for nausea, vomiting, diarrhea, constipation, blood in stool, abdominal distention and rectal pain.  Genitourinary: Negative for dysuria, urgency, frequency, hematuria, flank pain and difficulty urinating.  Musculoskeletal: Negative for back pain, neck pain and neck stiffness.  Skin: Negative for rash.  Neurological: Negative for weakness.  Hematological: Negative for adenopathy.  Psychiatric/Behavioral: Negative for confusion.  All other systems reviewed and are negative.      Allergies  Review of patient's allergies indicates no known allergies.  Home Medications   Current Outpatient Rx  Name  Route  Sig  Dispense  Refill  . esomeprazole (NEXIUM) 20 MG capsule   Oral   Take 20 mg by mouth daily at 12 noon.          BP 117/77  Pulse 64  Temp(Src) 97.1 F (36.2 C) (Oral)  Resp 17  SpO2 100%  LMP 03/11/2013 Physical Exam  Nursing note and vitals reviewed. Constitutional: She is oriented to person, place, and time. She appears well-developed and well-nourished. She appears distressed.  Awake, alert, patient in severe distress due to pain, hunched over and unable to sit up.  HENT:  Head: Normocephalic and atraumatic.  Mouth/Throat: Oropharynx is clear and moist. No oropharyngeal exudate.  Eyes: Conjunctivae are normal. No scleral icterus.  Neck: Normal range of motion. Neck supple.  Cardiovascular: Normal rate, regular rhythm, normal heart sounds and intact  distal pulses.   No murmur heard. No tachycardia  Pulmonary/Chest: Effort normal and breath sounds normal. No respiratory distress. She has no wheezes.  Abdominal: Soft. Bowel sounds are normal. She exhibits no distension and no mass. There is tenderness in the periumbilical  area. There is guarding. There is no rebound and no CVA tenderness.  Generalized tenderness with significant increase to palpation in the periumbilical region - exam limited as she is unable to lay flat due to pain Patient with guarding but no rebound or peritoneal signs No CVA tenderness  Musculoskeletal: Normal range of motion. She exhibits no edema.  Lymphadenopathy:    She has no cervical adenopathy.  Neurological: She is alert and oriented to person, place, and time. She exhibits normal muscle tone. Coordination normal.  Speech is clear and goal oriented Moves extremities without ataxia  Skin: Skin is warm and dry. She is not diaphoretic. No erythema.  Psychiatric: She has a normal mood and affect.    ED Course  Procedures (including critical care time) Labs Review Labs Reviewed  URINALYSIS, ROUTINE W REFLEX MICROSCOPIC - Abnormal; Notable for the following:    APPearance CLOUDY (*)    All other components within normal limits  CBC WITH DIFFERENTIAL - Abnormal; Notable for the following:    Neutrophils Relative % 36 (*)    Lymphocytes Relative 50 (*)    All other components within normal limits  COMPREHENSIVE METABOLIC PANEL - Abnormal; Notable for the following:    Total Bilirubin 0.2 (*)    All other components within normal limits  PREGNANCY, URINE  LIPASE, BLOOD   Imaging Review No results found.  EKG Interpretation   None       MDM   Final diagnoses:  None   Grace Reed presents with periumbilical abdominal pain rated at a 10 out of 10. Patient visibly distressed on exam. Significant pain to palpation in that. Umbilical region. Exam limited because patient is unable to lie flat. No lipase. Will repeat this. We'll also give pain control and obtain CT scan.  7:34 PM  On reevaluation patient resting comfortably. Abdominal pain resolved with morphine. Abdomen soft and nontender on exam. No hepatosplenomegaly. No CVA tenderness. Lipase within normal limits.   No emesis here in the department. CT scan pending.  8:30 PM Pt discussed with Ivonne AndrewPeter Dammen, PA-C.  CT scan pending.  If normal, pt will be d/c home.    [name], PA-C 04/15/13 2030

## 2013-04-15 NOTE — ED Notes (Signed)
Hannah, PA at bedside  

## 2013-04-15 NOTE — ED Notes (Signed)
Ct called to inform of patient having finished oral contrast.

## 2013-04-15 NOTE — ED Notes (Signed)
Pt c/o mid to upper abd pain onset approx 30 mins ago.  Pt st's pain is sharpe.  Denies any nausea, vomiting or diarrhea.  Last BM this am.

## 2013-04-16 NOTE — ED Provider Notes (Signed)
Medical screening examination/treatment/procedure(s) were performed by non-physician practitioner and as supervising physician I was immediately available for consultation/collaboration.  EKG Interpretation   None         Lossie Kalp W Jaeven Wanzer, MD 04/16/13 1015 

## 2013-04-17 NOTE — ED Provider Notes (Signed)
Medical screening examination/treatment/procedure(s) were performed by non-physician practitioner and as supervising physician I was immediately available for consultation/collaboration.  EKG Interpretation   None         Grace Deines T GoldAudree Camelston, MD 04/17/13 [phone]

## 2015-06-25 IMAGING — CT CT ABD-PELV W/ CM
2 of 4 series · 17 of 46 positions shown, 19 images · IV contrast (APPLIED)
Comparison: None.

CLINICAL DATA: Mid upper abdominal pain, sharp abdominal pain

EXAM:
CT ABDOMEN AND PELVIS WITH CONTRAST
TECHNIQUE: Multidetector CT imaging of the abdomen and pelvis was performed
using the standard protocol following bolus administration of
intravenous contrast.
CONTRAST:  80mL OMNIPAQUE IOHEXOL 300 MG/ML  SOLN

[Series 2: abd/ pelvis 5.0 i30f 1 · axial · 0.63mm/px · z∈[-416,-56]mm · 14 of 80 slices shown, 16 images]
[im 4/80  soft-tissue]
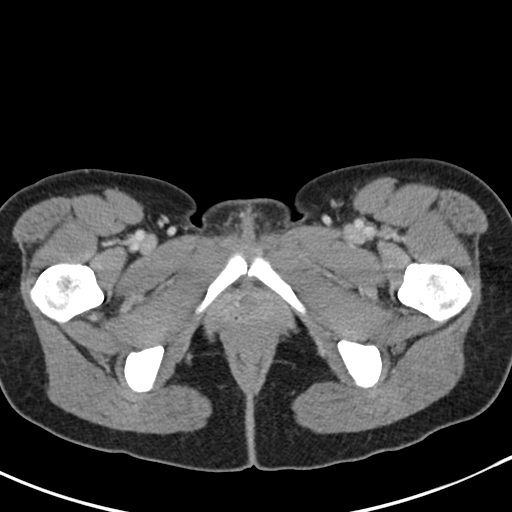
[im 4/80  bone]
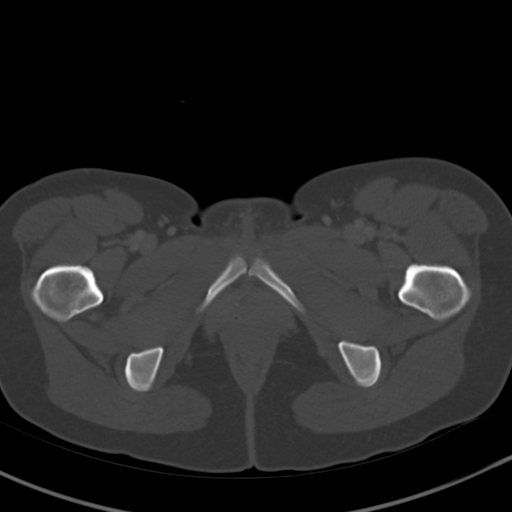
[im 10/80  soft-tissue]
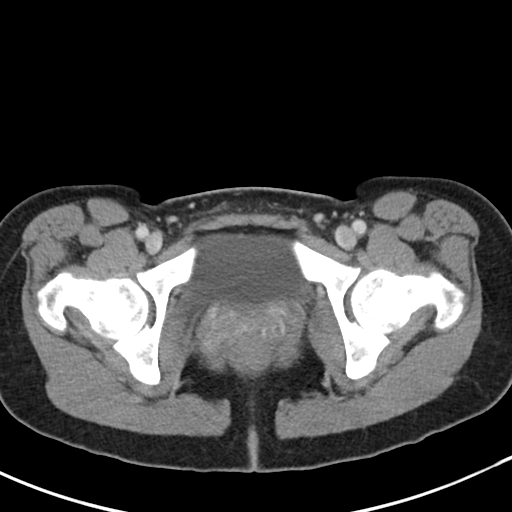
[im 16/80  soft-tissue]
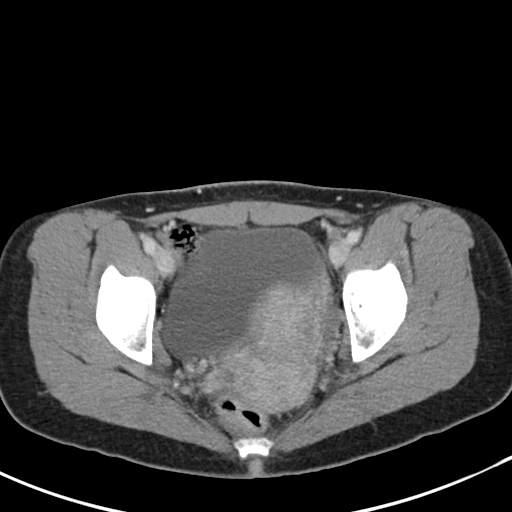
[im 22/80  soft-tissue]
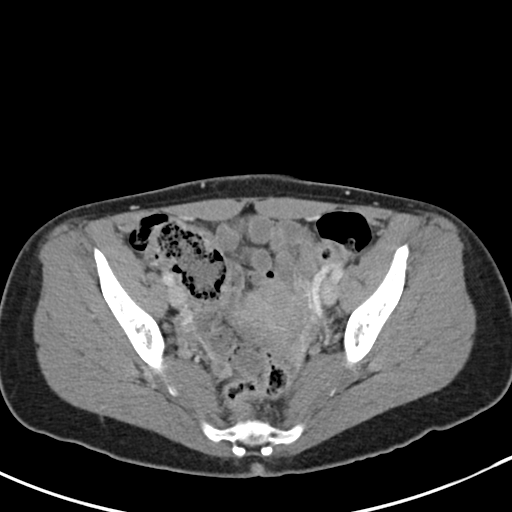
[im 28/80  soft-tissue]
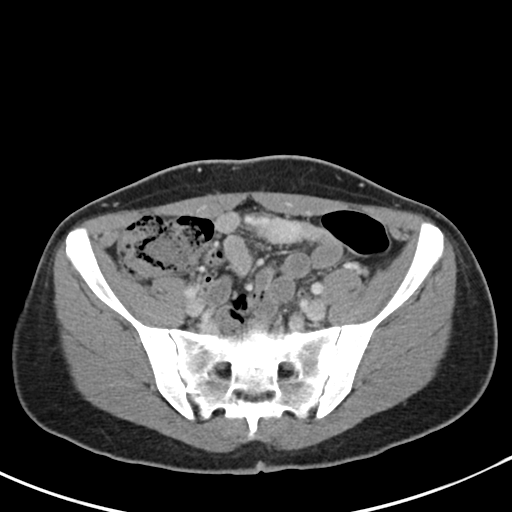
[im 31/80  soft-tissue]
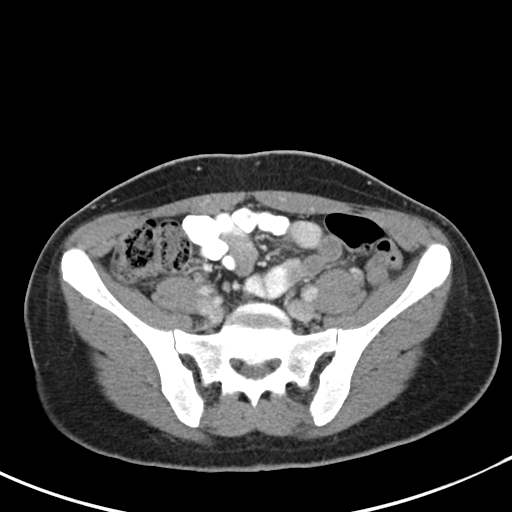
[im 37/80  soft-tissue]
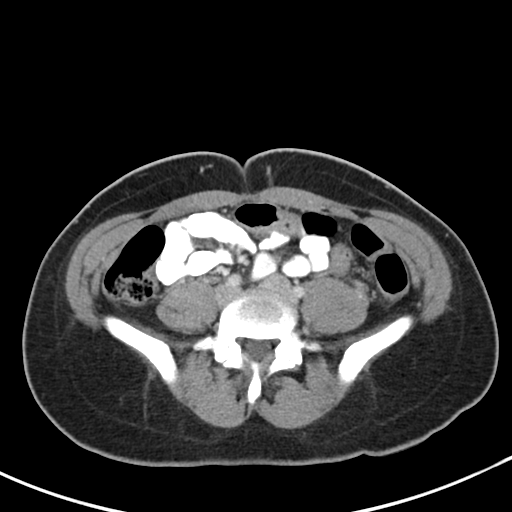
[im 43/80  soft-tissue]
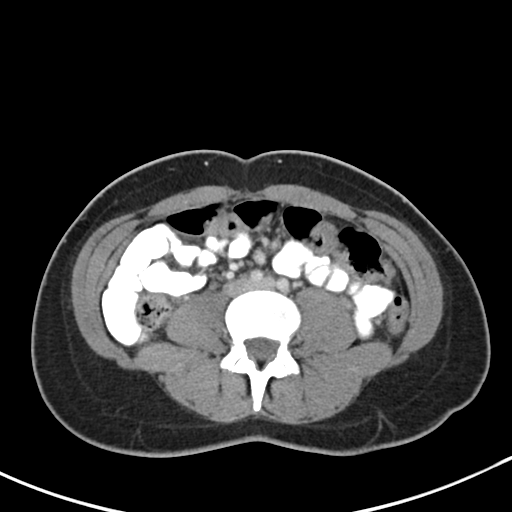
[im 49/80  soft-tissue]
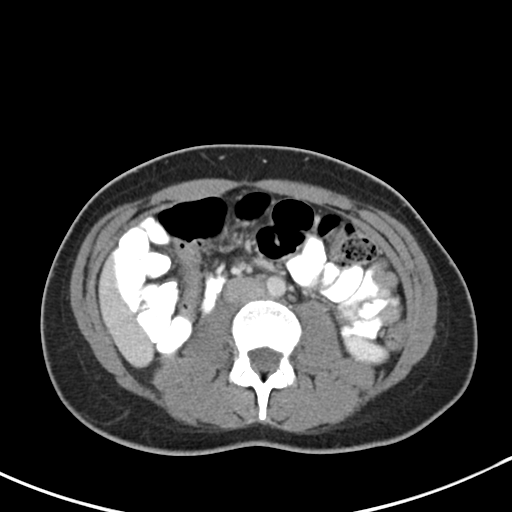
[im 49/80  bone]
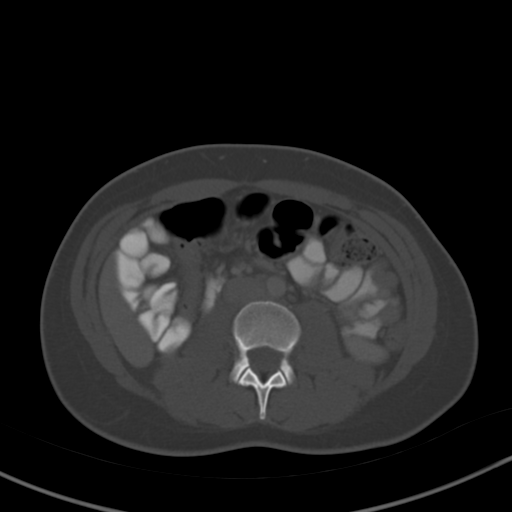
[im 52/80  soft-tissue]
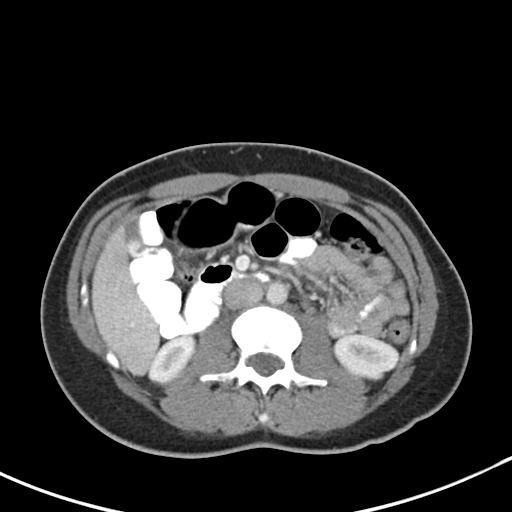
[im 58/80  soft-tissue]
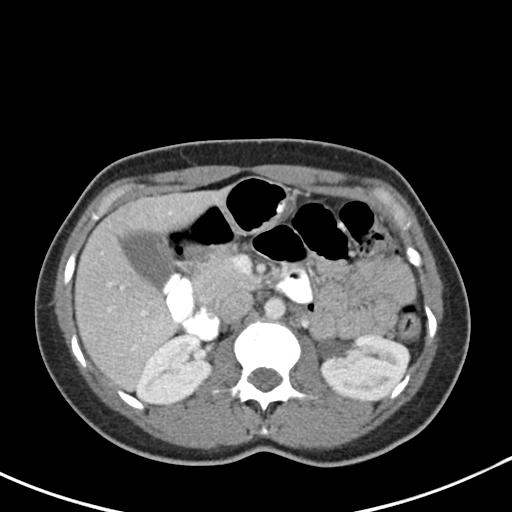
[im 64/80  soft-tissue]
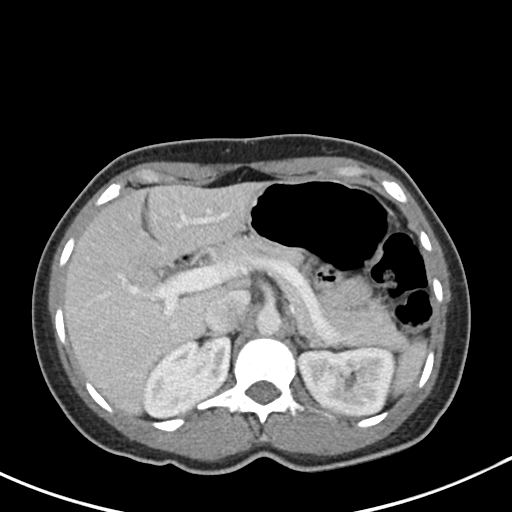
[im 70/80  soft-tissue]
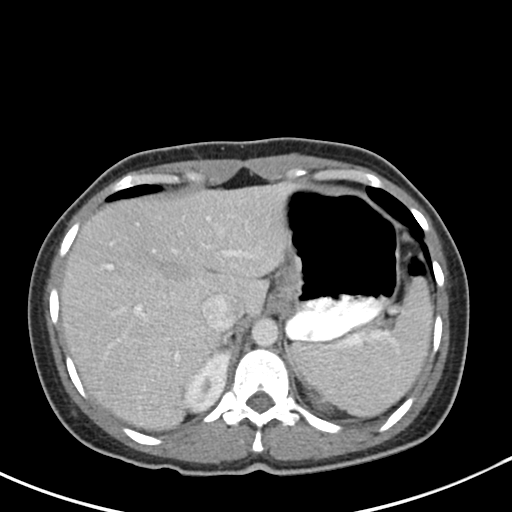
[im 76/80  soft-tissue]
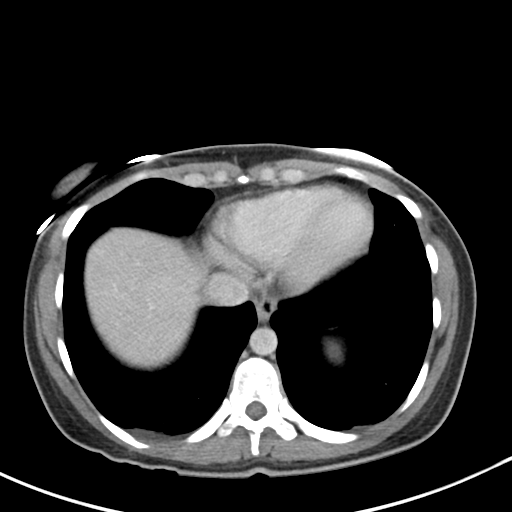

[Series 5: coronal soft tissue · coronal · 0.76mm/px · 3 of 66 slices shown]
[im 22/66  soft-tissue]
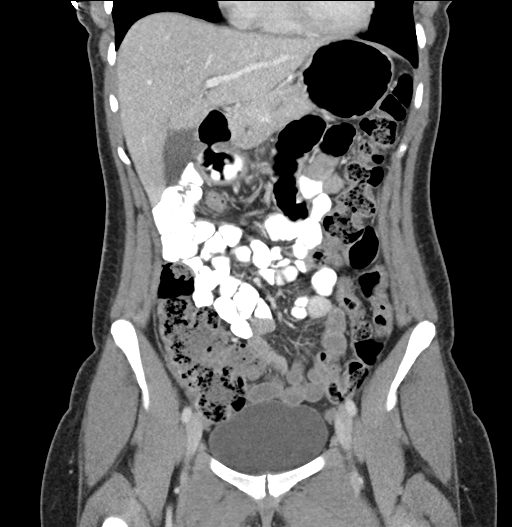
[im 29/66  soft-tissue]
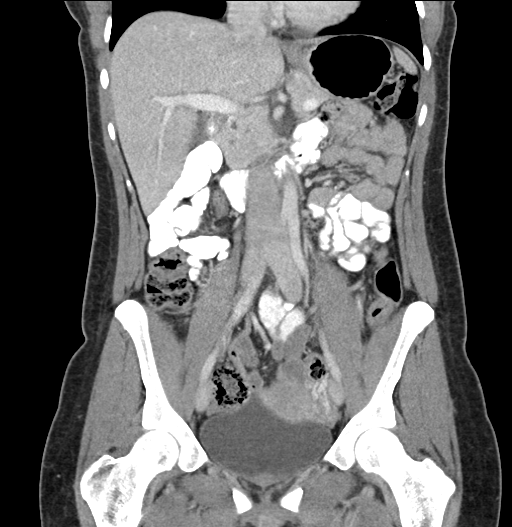
[im 37/66  soft-tissue]
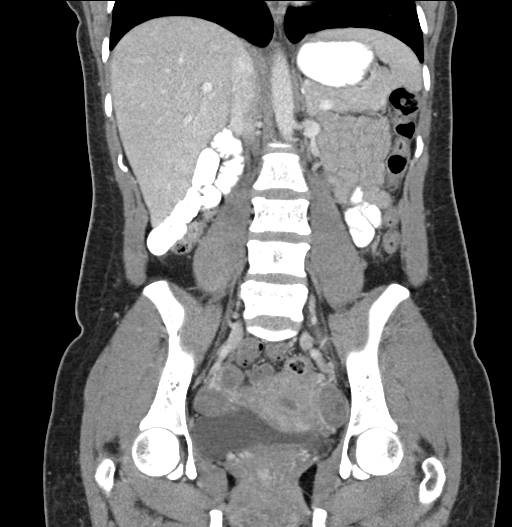

[17 of 46 positions shown; findings below may reference images not displayed]

FINDINGS: Lung bases are clear.  No pericardial fluid.

No focal hepatic lesion. Gallbladder, the gallbladder is normal.
There is a variant pancreatic ductal anatomy (ductus divisum). The
common bile duct is normal caliber. The pancreatic duct is not
inflamed. No evidence of pancreatic inflammation.

The spleen, adrenal glands, and kidneys are normal.

The stomach, small bowel, appendix, and cecum are normal. The colon
and rectosigmoid colon are normal.

Abdominal aorta is normal caliber. No retroperitoneal or periportal
lymphadenopathy. No mesenteric adenopathy.

No free fluid in the pelvis. No distal ureteral stones or bladder
stones. Ovaries and uterus are normal. No aggressive osseous lesion.
IMPRESSION: 1. No acute findings in the abdomen or pelvis.
2. Variant pancreatic ductal anatomy (ductus divisum). This can
predispose patients to pancreatitis.
